# Patient Record
Sex: Female | Born: 2000
Health system: Southern US, Community
[De-identification: ages and names within clinical notes are randomized; demographics above are authoritative.]

---
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
## 2018-07-27 DIAGNOSIS — Z30011 Encounter for initial prescription of contraceptive pills: Secondary | ICD-10-CM | POA: Diagnosis not present

## 2018-07-27 DIAGNOSIS — Z23 Encounter for immunization: Secondary | ICD-10-CM | POA: Diagnosis not present

## 2018-08-31 DIAGNOSIS — J020 Streptococcal pharyngitis: Secondary | ICD-10-CM | POA: Diagnosis not present

## 2018-08-31 DIAGNOSIS — J029 Acute pharyngitis, unspecified: Secondary | ICD-10-CM | POA: Diagnosis not present

## 2018-08-31 DIAGNOSIS — Z20828 Contact with and (suspected) exposure to other viral communicable diseases: Secondary | ICD-10-CM | POA: Diagnosis not present

## 2018-08-31 DIAGNOSIS — Z789 Other specified health status: Secondary | ICD-10-CM | POA: Diagnosis not present

## 2018-09-19 IMAGING — DX DG SCOLIOSIS EVAL COMPLETE SPINE 1V
1 series · 3 of 3 positions shown · non-contrast
Comparison: None.

CLINICAL DATA: Scoliosis.  Upper back pain.

EXAM:
DG SCOLIOSIS EVAL COMPLETE SPINE 1V

[Series 1: whole body ap · 0.14mm/px · 3 of 3 slices shown]
[im 1/3]
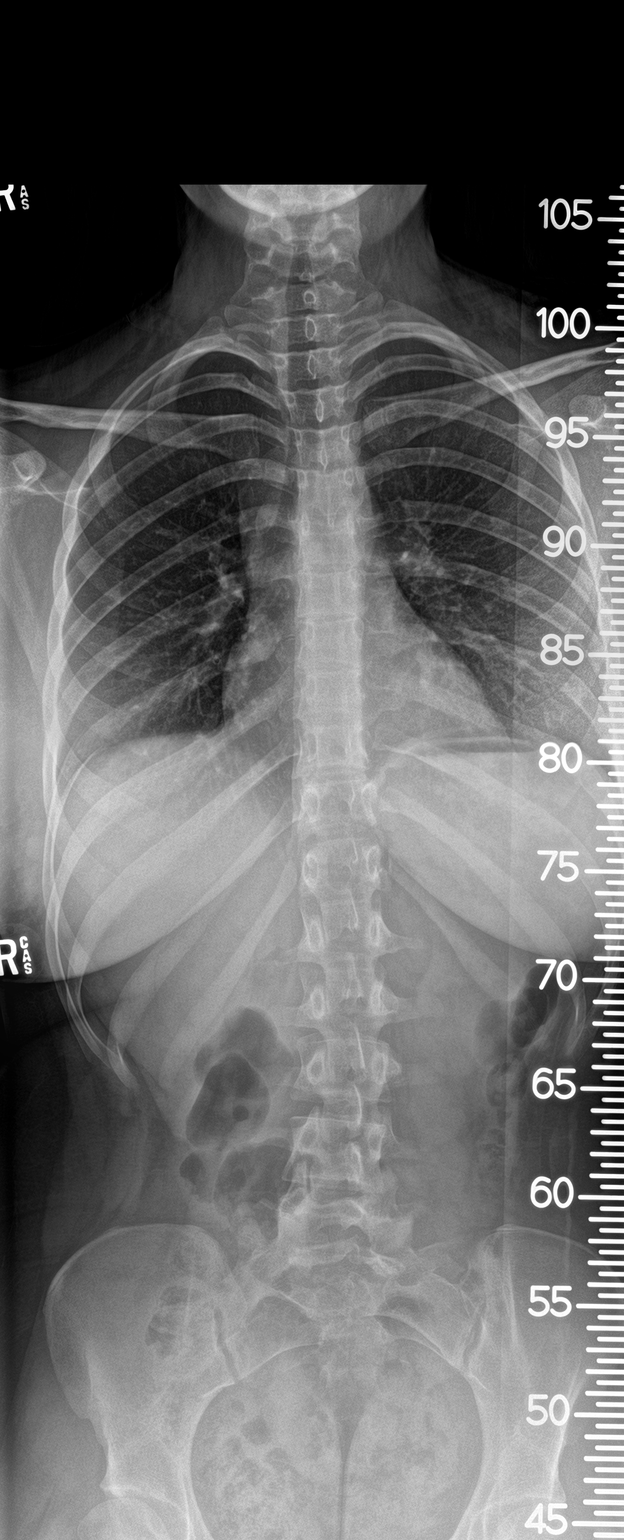
[im 2/3]
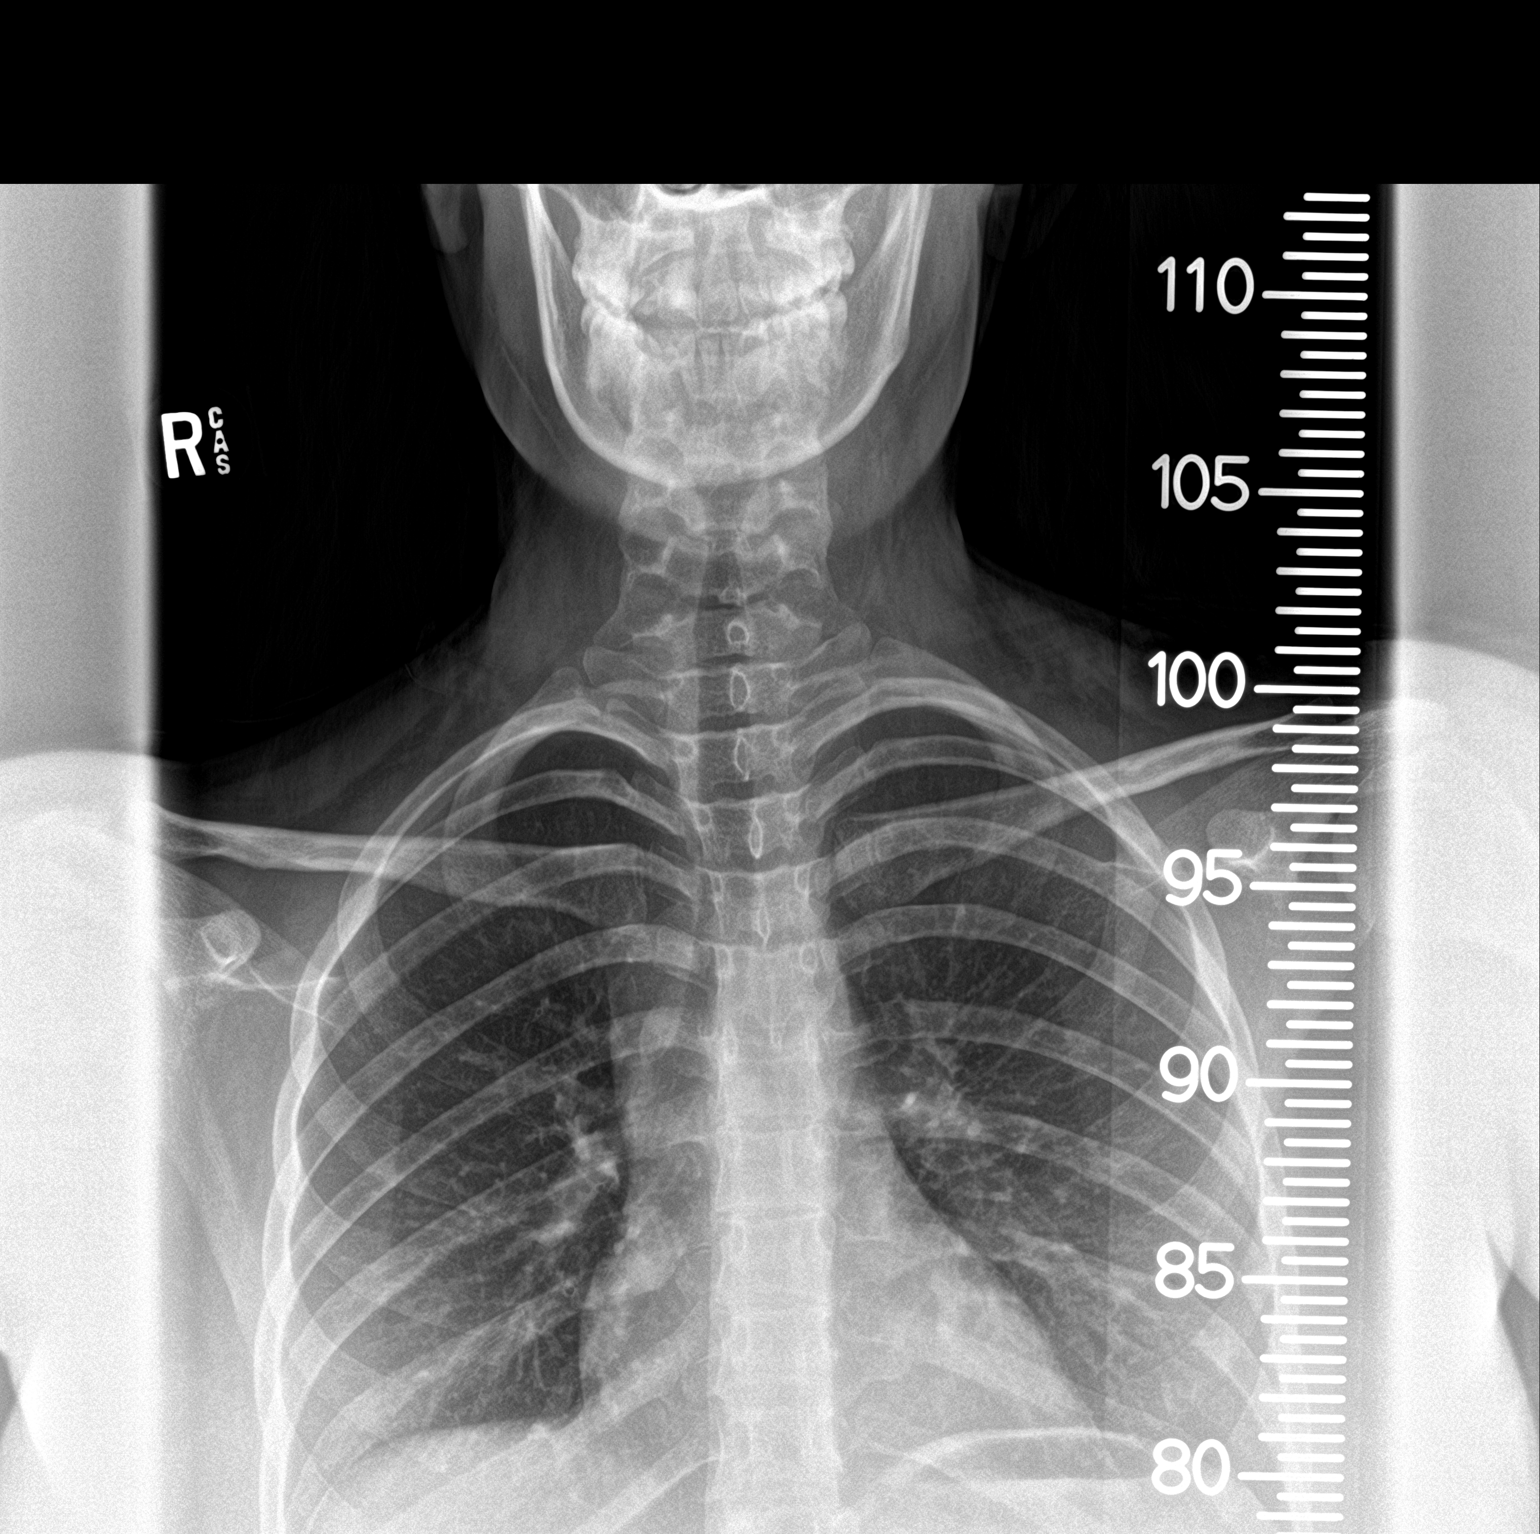
[im 3/3]
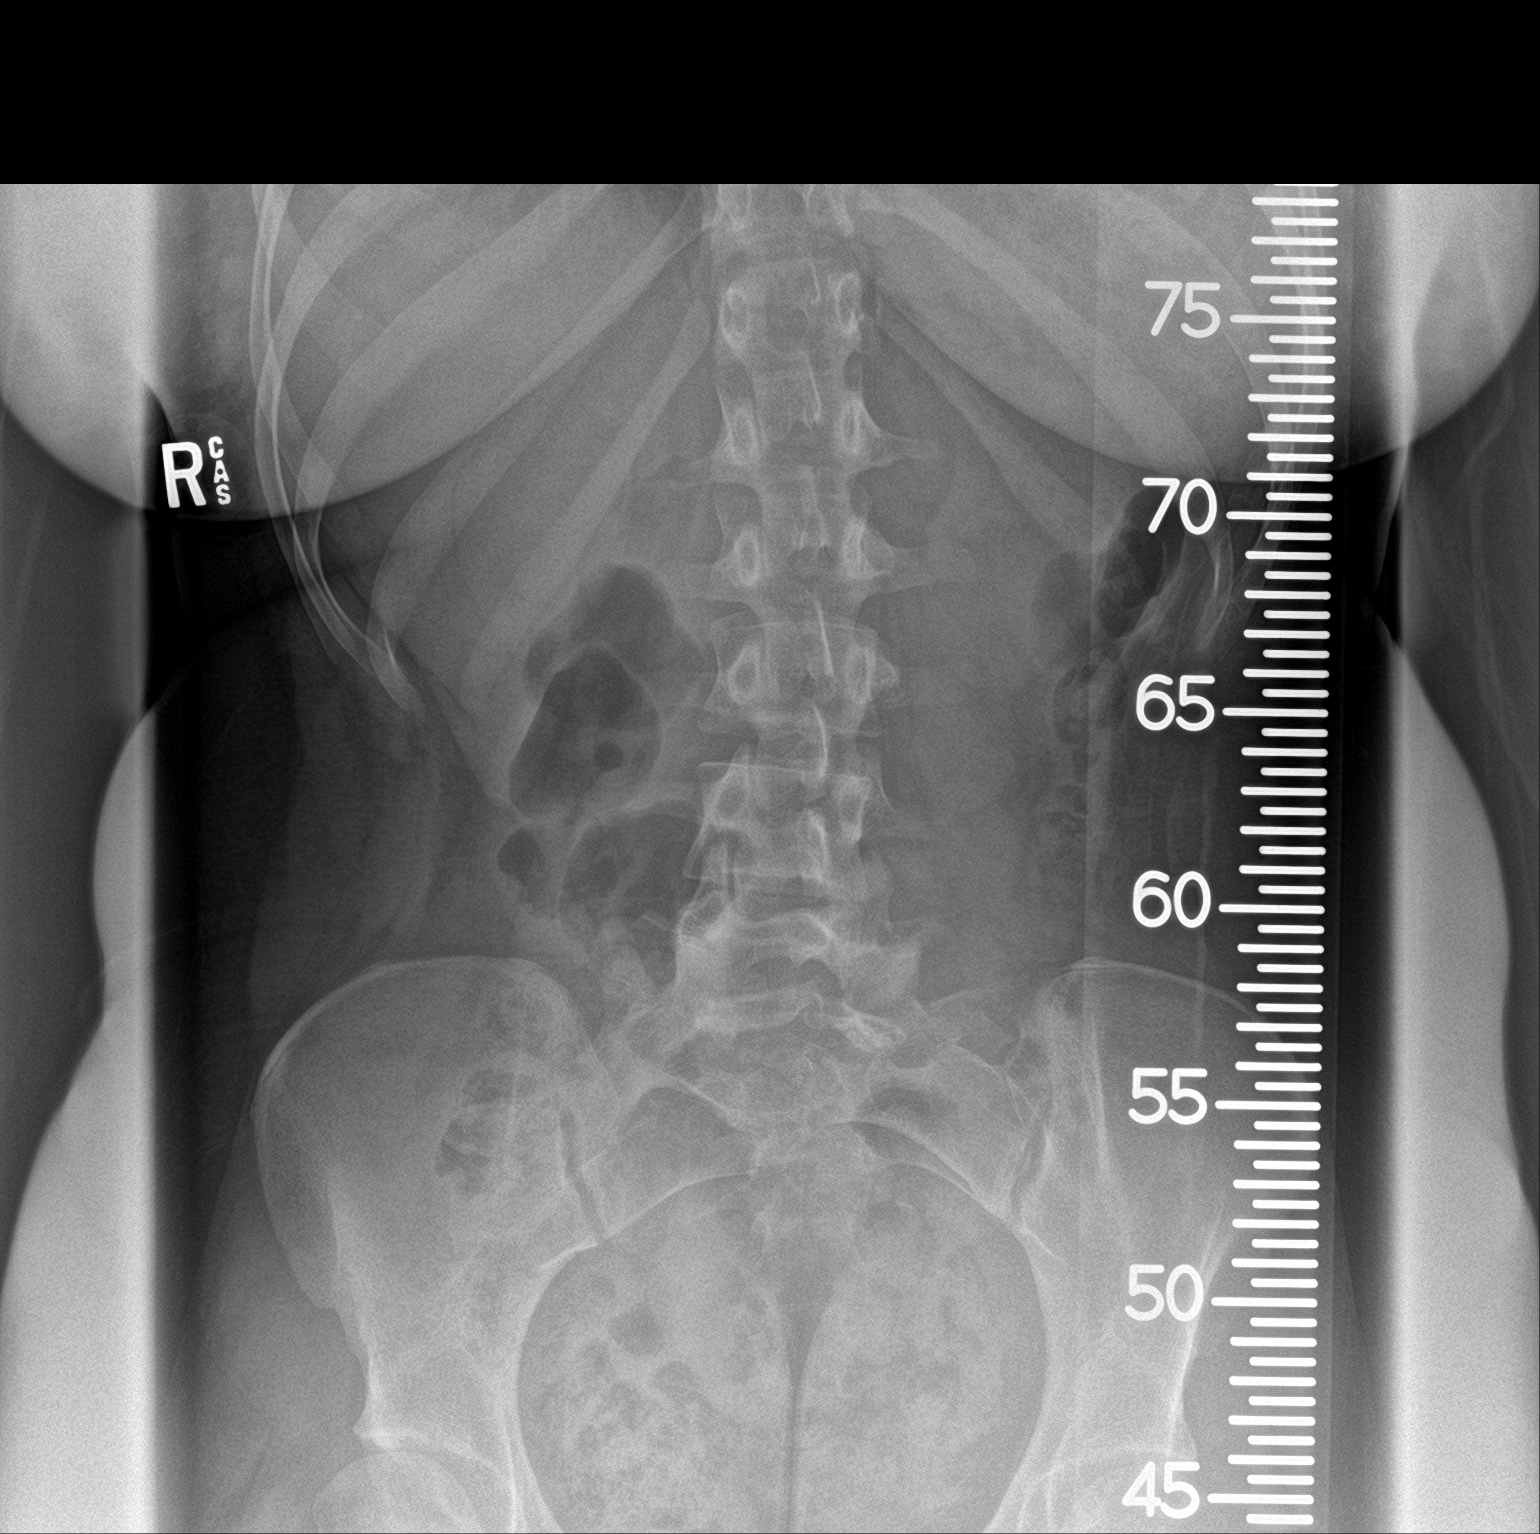

[3 of 3 positions shown; findings below may reference images not displayed]

FINDINGS: PA view of the dorsal spine demonstrate 6 degrees of levoconvex
curvature as imaged from superior endplate of T11 through inferior
endplate of L3, apex at L1-2 disc level. No significant pelvic tilt.
No vertebral body anomaly.
IMPRESSION: Approximately 6 degrees of levoconvex curvature of the upper lumbar
spine, apex at L1-2 as above.

## 2018-11-25 IMAGING — DX DG CHEST 2V
2 series · 2 of 2 positions shown · non-contrast
Comparison: None.

CLINICAL DATA: Fever

EXAM:
CHEST - 2 VIEW

[chest pa]
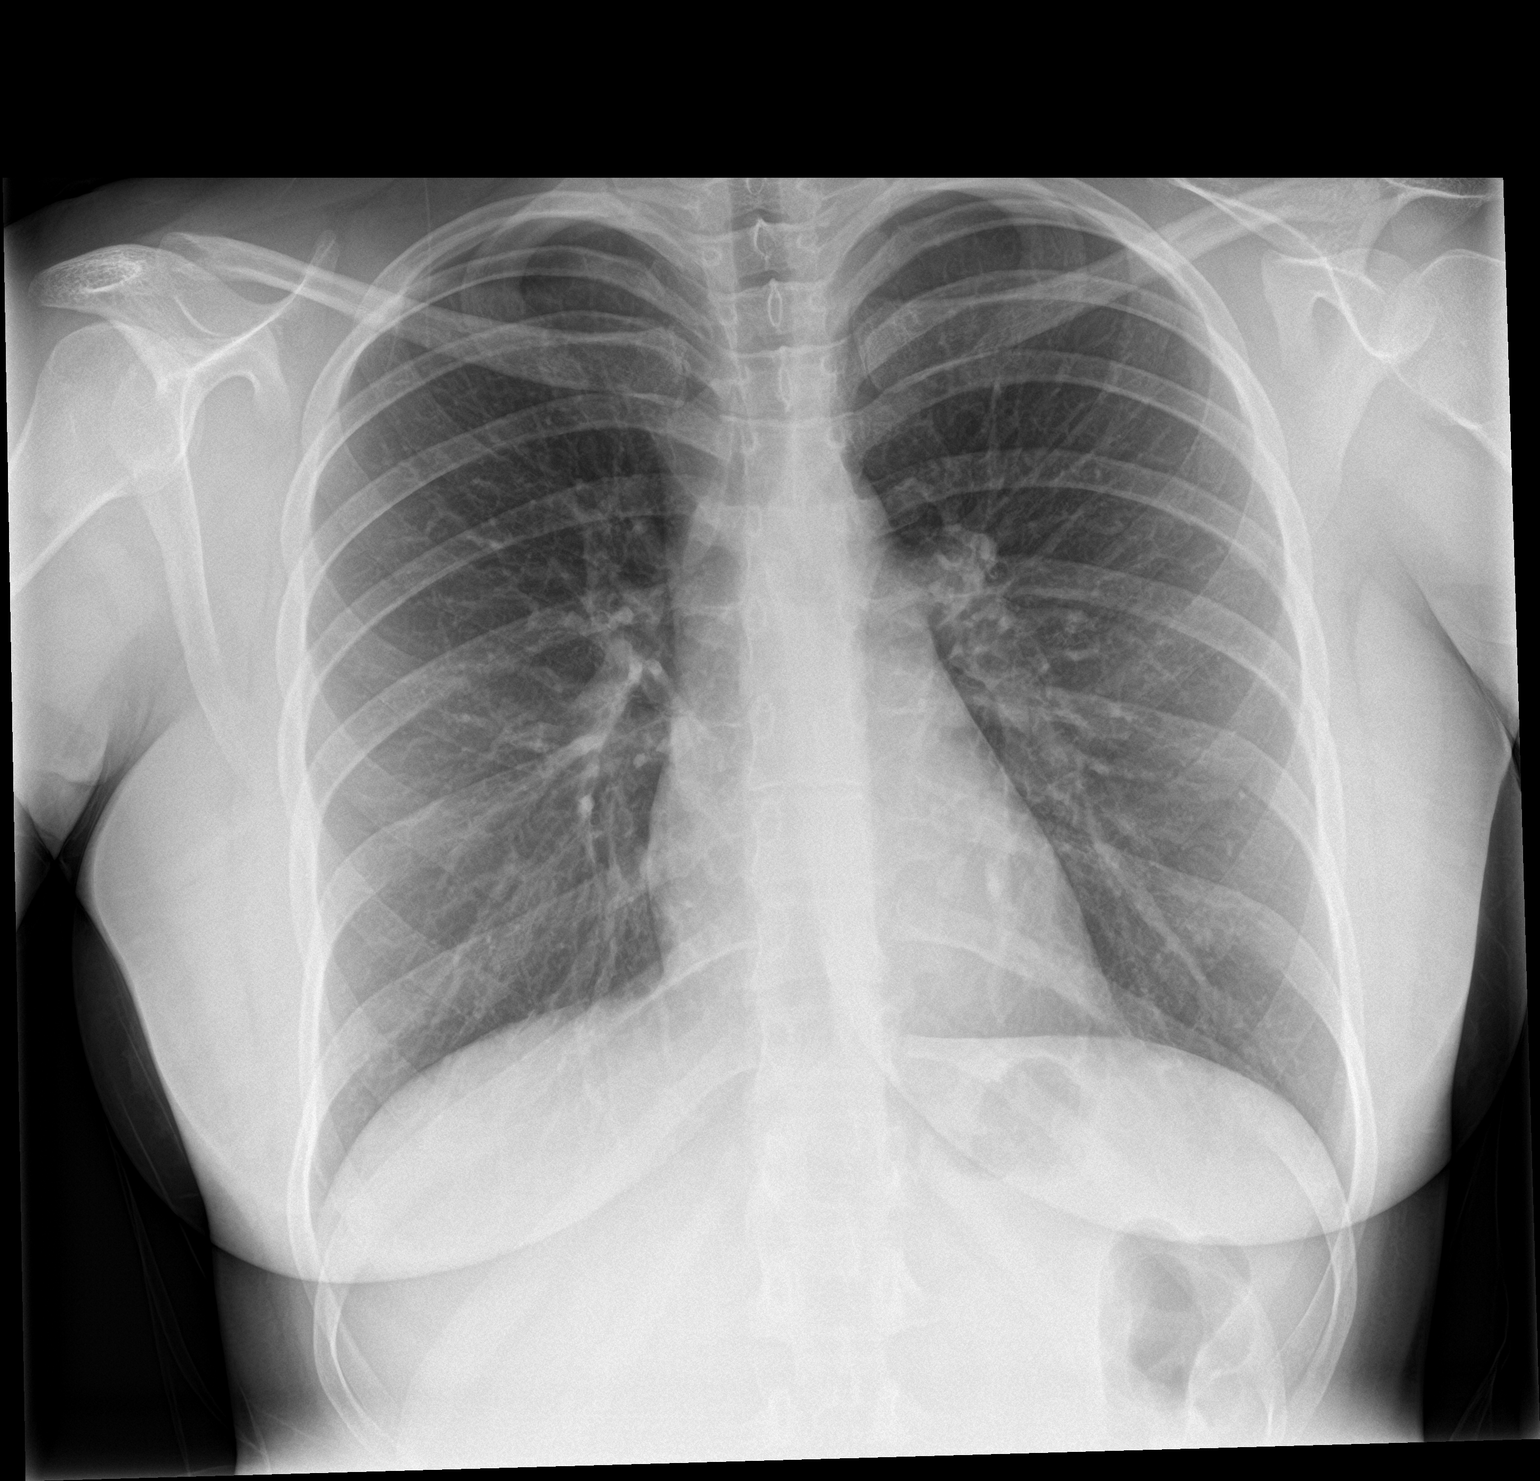

[chest lat]
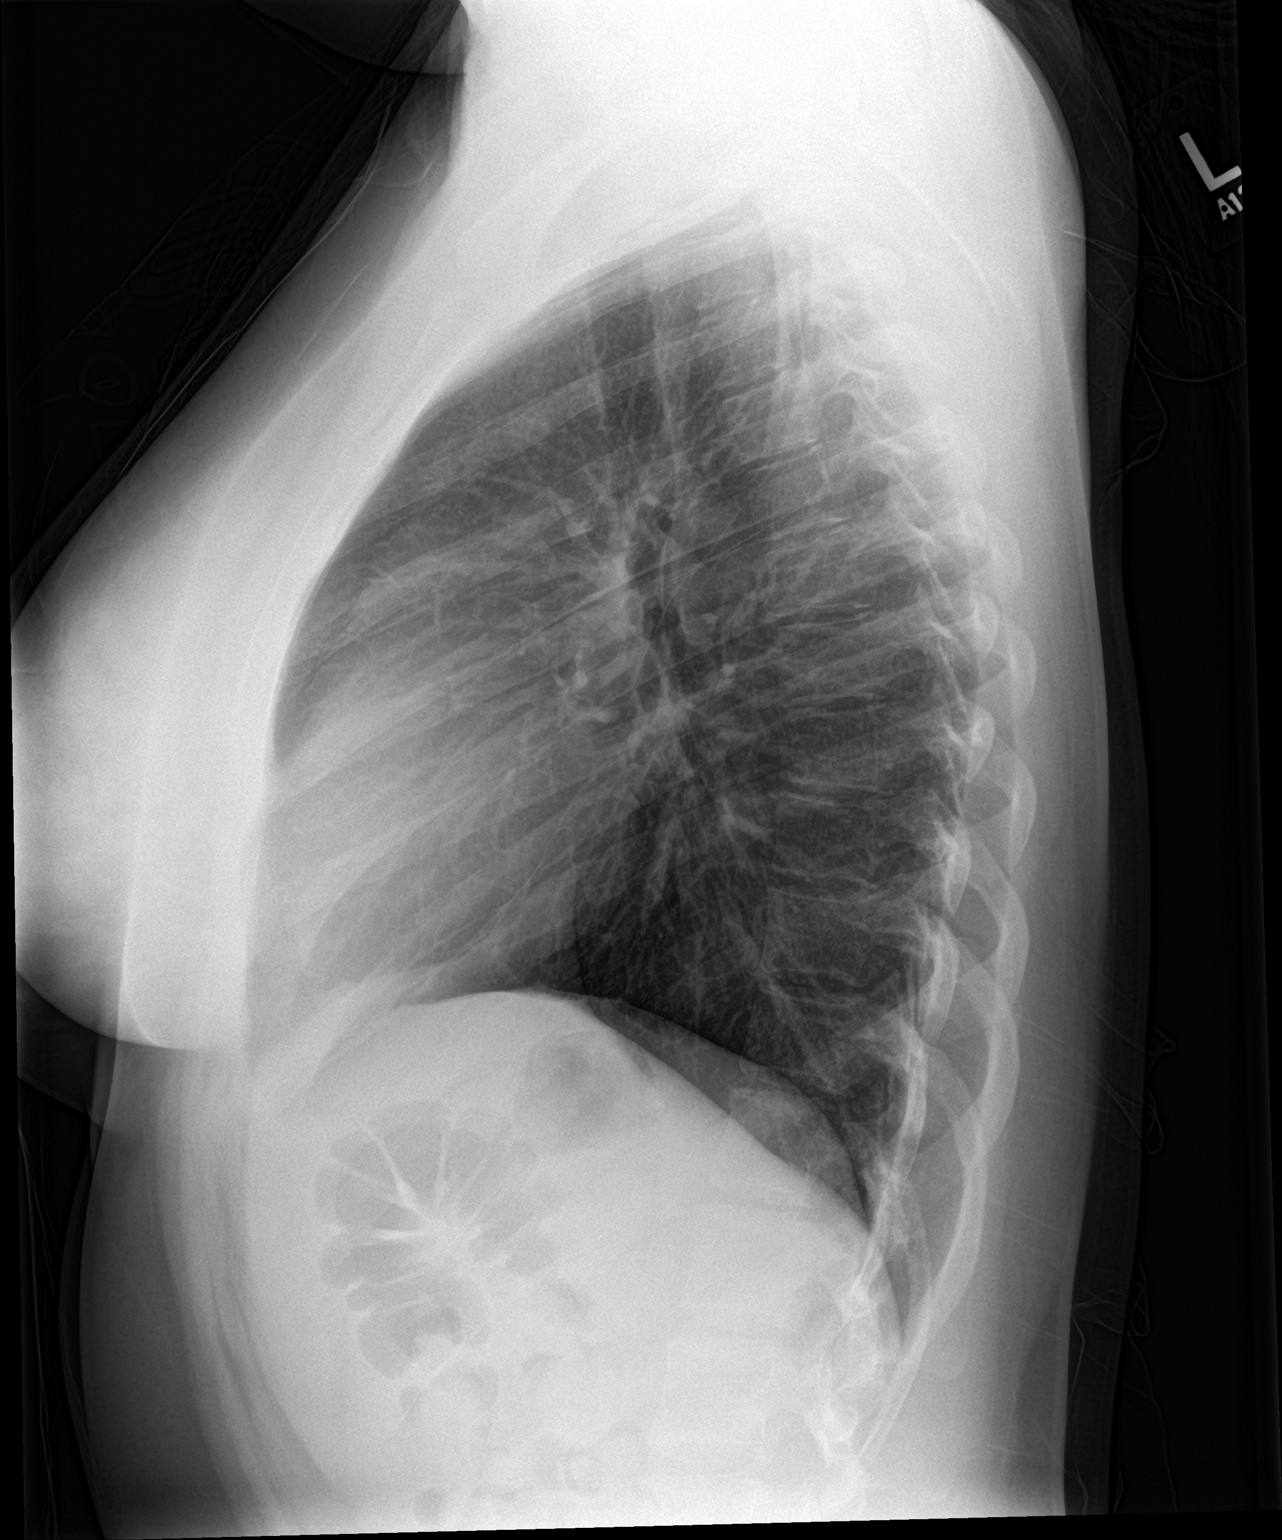

[2 of 2 positions shown; findings below may reference images not displayed]

FINDINGS: Lungs are clear. Heart size and pulmonary vascularity are normal. No
adenopathy. No bone lesions.
IMPRESSION: No edema or consolidation.
